# Patient Record
Sex: Female | Born: 1995 | Race: White | Hispanic: No | Marital: Single | Smoking: Former smoker
Health system: Southern US, Community
[De-identification: ages and names within clinical notes are randomized; demographics above are authoritative.]

## PROBLEM LIST (undated history)

## (undated) DIAGNOSIS — Z789 Other specified health status: Secondary | ICD-10-CM

## (undated) HISTORY — PX: OTHER SURGICAL HISTORY: SHX169

---

## 2019-04-13 ENCOUNTER — Other Ambulatory Visit: Payer: Self-pay

## 2019-04-13 ENCOUNTER — Encounter (HOSPITAL_COMMUNITY): Payer: Self-pay

## 2019-04-13 ENCOUNTER — Emergency Department (HOSPITAL_COMMUNITY)
Admission: EM | Admit: 2019-04-13 | Discharge: 2019-04-13 | Disposition: A | Discharge status: Home / Self Care | Payer: Medicaid Other | Attending: Emergency Medicine | Admitting: Emergency Medicine

## 2019-04-13 ENCOUNTER — Emergency Department (HOSPITAL_COMMUNITY): Payer: Medicaid Other

## 2019-04-13 DIAGNOSIS — R0602 Shortness of breath: Secondary | ICD-10-CM | POA: Diagnosis not present

## 2019-04-13 DIAGNOSIS — F17210 Nicotine dependence, cigarettes, uncomplicated: Secondary | ICD-10-CM | POA: Diagnosis not present

## 2019-04-13 DIAGNOSIS — R0789 Other chest pain: Secondary | ICD-10-CM | POA: Diagnosis not present

## 2019-04-13 HISTORY — DX: Other specified health status: Z78.9

## 2019-04-13 LAB — CBC WITH DIFFERENTIAL/PLATELET
Abs Immature Granulocytes: 0.05 10*3/uL (ref 0.00–0.07)
Basophils Absolute: 0.1 10*3/uL (ref 0.0–0.1)
Basophils Relative: 1 %
Eosinophils Absolute: 0.8 10*3/uL — ABNORMAL HIGH (ref 0.0–0.5)
Eosinophils Relative: 7 %
HCT: 46.2 % — ABNORMAL HIGH (ref 36.0–46.0)
Hemoglobin: 14.9 g/dL (ref 12.0–15.0)
Immature Granulocytes: 0 %
Lymphocytes Relative: 28 %
Lymphs Abs: 3.2 10*3/uL (ref 0.7–4.0)
MCH: 29 pg (ref 26.0–34.0)
MCHC: 32.3 g/dL (ref 30.0–36.0)
MCV: 90.1 fL (ref 80.0–100.0)
Monocytes Absolute: 0.5 10*3/uL (ref 0.1–1.0)
Monocytes Relative: 4 %
Neutro Abs: 7 10*3/uL (ref 1.7–7.7)
Neutrophils Relative %: 60 %
Platelets: 502 10*3/uL — ABNORMAL HIGH (ref 150–400)
RBC: 5.13 MIL/uL — ABNORMAL HIGH (ref 3.87–5.11)
RDW: 12.7 % (ref 11.5–15.5)
WBC: 11.7 10*3/uL — ABNORMAL HIGH (ref 4.0–10.5)
nRBC: 0 % (ref 0.0–0.2)

## 2019-04-13 LAB — URINALYSIS, ROUTINE W REFLEX MICROSCOPIC
Bacteria, UA: NONE SEEN
Bilirubin Urine: NEGATIVE
Glucose, UA: NEGATIVE mg/dL
Ketones, ur: NEGATIVE mg/dL
Leukocytes,Ua: NEGATIVE
Nitrite: NEGATIVE
Protein, ur: NEGATIVE mg/dL
Specific Gravity, Urine: 1.008 (ref 1.005–1.030)
pH: 6 (ref 5.0–8.0)

## 2019-04-13 LAB — PREGNANCY, URINE: Preg Test, Ur: NEGATIVE

## 2019-04-13 LAB — BASIC METABOLIC PANEL
Anion gap: 11 (ref 5–15)
BUN: 8 mg/dL (ref 6–20)
CO2: 22 mmol/L (ref 22–32)
Calcium: 9.9 mg/dL (ref 8.9–10.3)
Chloride: 107 mmol/L (ref 98–111)
Creatinine, Ser: 0.72 mg/dL (ref 0.44–1.00)
GFR calc Af Amer: >60 mL/min (ref >60)
GFR calc non Af Amer: >60 mL/min (ref >60)
Glucose, Bld: 93 mg/dL (ref 70–99)
Potassium: 3.5 mmol/L (ref 3.5–5.1)
Sodium: 140 mmol/L (ref 135–145)

## 2019-04-13 LAB — I-STAT BETA HCG BLOOD, ED (MC, WL, AP ONLY): I-stat hCG, quantitative: <5 m[IU]/mL (ref <5)

## 2019-04-13 LAB — D-DIMER, QUANTITATIVE: D-Dimer, Quant: 0.33 ug/mL-FEU (ref 0.00–0.50)

## 2019-04-13 LAB — TROPONIN I: Troponin I: <0.03 ng/mL (ref <0.03)

## 2019-04-13 MED ORDER — METHOCARBAMOL 500 MG PO TABS: 500.0000 mg | ORAL_TABLET | Freq: Two times a day (BID) | ORAL | 0 refills | Status: AC

## 2019-04-13 MED ORDER — KETOROLAC TROMETHAMINE 30 MG/ML IJ SOLN
30.0000 mg | Freq: Once | INTRAMUSCULAR | Status: AC
  Administered 2019-04-13: 22:00:00 30 mg via INTRAVENOUS
  Filled 2019-04-13: qty 1

## 2019-04-13 NOTE — ED Triage Notes (Signed)
Patient c/o CP for the last 6-1/2 hours and thinks it's acid reflux. Tender with palpation. Given 324 asa and 1 ntiro. Took tablet for heartburn with no relief. NAD noted at this time.

## 2019-04-13 NOTE — ED Provider Notes (Signed)
[facility] EMERGENCY DEPARTMENT Provider Note   CSN: [id number] Arrival date & time: 04/13/19  2032    History   Chief Complaint Chief Complaint  Patient presents with  . Chest Pain    HPI Bailey Mullins is a 23 y.o. female with no significant past female history who presents for evaluation of midsternal chest pain that began at about 12 PM this afternoon.  She states that since onset of symptoms, the chest pain has been constant.  Patient reports that she was sitting down when she started having midsternal chest pain.  She reports initially, the pain was sharp only in the midsternal region.  She states that then it started becoming a pressure.  She states that the pain was worse with deep inspiration, palpation and exertion.  She did not have any associated diaphoresis or nausea.  Patient states that she took some acid reflux medication but did not help.  She also reports she had some associated shortness of breath and felt like "she could not catch her breath."  Patient reports she tried to sit at home and states that it would get better but when it was not, her brother called EMS.  On EMS arrival, they gave 324 aspirin and 1 times nitro.  Patient states that after the nitro, her pain somewhat improved.  She states that it is currently 5/10.  Patient states she has no previous issues with her heart.  She denies any family history of MIs before the age of 42.  She reports she smokes but denies any cocaine, heroin, marijuana use. She denies any OCP use, recent immobilization, prior history of DVT/PE, recent surgery, leg swelling, or long travel.  Patient denies any fevers, cough, abdominal pain, nausea/vomiting.     The history is provided by the patient.    Past Medical History:  Diagnosis Date  . Known health problems: none     There are no active problems to display for this patient.   Past Surgical History:  Procedure Laterality Date  . No surgical hx       OB  History   No obstetric history on file.      Home Medications    Prior to Admission medications   Medication Sig Start Date End Date Taking? Authorizing Provider  OVER THE COUNTER MEDICATION Take 250 mg by mouth every 6 (six) hours as needed (pain/headache).   Yes [provider]  methocarbamol (ROBAXIN) 500 MG tablet Take 1 tablet (500 mg total) by mouth 2 (two) times daily. 04/13/19   Maxwell Caul, PA-C    Family History History reviewed. No pertinent family history.  Social History Social History   Tobacco Use  . Smoking status: Current Every Day Smoker  . Smokeless tobacco: Never Used  Substance Use Topics  . Alcohol use: Never    Frequency: Never  . Drug use: Never     Allergies   Patient has no known allergies.   Review of Systems Review of Systems  Constitutional: Negative for fever.  Respiratory: Positive for shortness of breath. Negative for cough.   Cardiovascular: Positive for chest pain. Negative for leg swelling.  Gastrointestinal: Negative for abdominal pain, nausea and vomiting.  Genitourinary: Negative for dysuria and hematuria.  Neurological: Negative for headaches.  All other systems reviewed and are negative.    Physical Exam Updated Vital Signs BP (!) 113/51   Pulse 92   Temp 99.5 F (37.5 C) (Oral)   Resp (!) 24  Ht  (1.753 m)   Wt 113.9 kg   LMP 04/13/2019 (Exact Date)   SpO2 98%   BMI 37.07 kg/m   Physical Exam Vitals signs and nursing note reviewed.  Constitutional:      Appearance: Normal appearance. She is well-developed.  HENT:     Head: Normocephalic and atraumatic.  Eyes:     General: Lids are normal.     Conjunctiva/sclera: Conjunctivae normal.     Pupils: Pupils are equal, round, and reactive to light.  Neck:     Musculoskeletal: Full passive range of motion without pain.  Cardiovascular:     Rate and Rhythm: Normal rate and regular rhythm.     Pulses: Normal pulses.          Radial pulses are  2+ on the right side and 2+ on the left side.       Dorsalis pedis pulses are 2+ on the right side and 2+ on the left side.     Heart sounds: Normal heart sounds. No murmur. No friction rub. No gallop.   Pulmonary:     Effort: Pulmonary effort is normal.     Breath sounds: Normal breath sounds.     Comments: Lungs clear to auscultation bilaterally.  Symmetric chest rise.  No wheezing, rales, rhonchi.  Speaking in full sentences without any difficulty.  No evidence of respiratory distress. Chest:     Comments: Pain reproduced with palpation of the midsternal region.  No deformity or crepitus noted. Abdominal:     Palpations: Abdomen is soft. Abdomen is not rigid.     Tenderness: There is no abdominal tenderness. There is no guarding.  Musculoskeletal: Normal range of motion.     Comments: Bilateral lower extremities are symmetric in appearance without any overlying warmth, erythema, edema.  Skin:    General: Skin is warm and dry.     Capillary Refill: Capillary refill takes less than 2 seconds.  Neurological:     Mental Status: She is alert and oriented to person, place, and time.  Psychiatric:        Speech: Speech normal.      ED Treatments / Results  Labs (all labs ordered are listed, but only abnormal results are displayed) Labs Reviewed  CBC WITH DIFFERENTIAL/PLATELET - Abnormal; Notable for the following components:      Result Value   WBC 11.7 (*)    RBC 5.13 (*)    HCT 46.2 (*)    Platelets 502 (*)    Eosinophils Absolute 0.8 (*)    All other components within normal limits  URINALYSIS, ROUTINE W REFLEX MICROSCOPIC - Abnormal; Notable for the following components:   Color, Urine STRAW (*)    Hgb urine dipstick SMALL (*)    All other components within normal limits  BASIC METABOLIC PANEL  D-DIMER, QUANTITATIVE (NOT AT [facility])  TROPONIN I  PREGNANCY, URINE  I-STAT BETA HCG BLOOD, ED ([facility], [facility], [facility] ONLY)    EKG EKG Interpretation  Date/Time:  Friday Apr 13 2019  20:37:26 EDT Ventricular Rate:  103 PR Interval:    QRS Duration: 92 QT Interval:  341 QTC Calculation: 447 R Axis:   65 Text Interpretation:  Sinus tachycardia No old tracing to compare Confirmed by Melene Plan [phone]) on 04/13/2019 8:49:47 PM   Radiology Dg Chest 2 View  Result Date: 04/13/2019 CLINICAL DATA:  Chest pain EXAM: CHEST - 2 VIEW COMPARISON:  None. FINDINGS: Heart and mediastinal contours are within normal limits. No focal  opacities or effusions. No acute bony abnormality. IMPRESSION: No active cardiopulmonary disease. Electronically Signed   By: Charlett NoseKevin  Dover M.D.   On: 04/13/2019 21:55    Procedures Procedures (including critical care time)  Medications Ordered in ED Medications  ketorolac (TORADOL) 30 MG/ML injection 30 mg (30 mg Intravenous Given 04/13/19 2151)     Initial Impression / Assessment and Plan / ED Course  I have reviewed the triage vital signs and the nursing notes.  Pertinent labs & imaging results that were available during my care of the patient were reviewed by me and considered in my medical decision making (see chart for details).        23 year old female who presents for evaluation of chest pain that began today at 7512 PM.  She reports some associated shortness of breath.  Chest pain worse with exertion, deep inspiration and palpation.  No associated diaphoresis, nausea/vomiting. Patient is afebrile, non-toxic appearing, sitting comfortably on examination table. Vital signs reviewed and stable.  On exam, pain reproduced with palpation.  Consider musculoskeletal pain versus anxiety.  Also consider ACS etiology but this sounds very atypical.  Low suspicion for PE but patient does report some pleuritic chest pain.  Plan to check labs, chest x-ray, EKG.  Troponin is negative.  BMP is unremarkable.  I-STAT beta is normal.  D-dimer is normal.  CBC shows slight leukocytosis of 11.7.  Otherwise unremarkable.  UA negative for any infectious etiology.  Chest  x-ray negative for any infectious etiology.  At this time, patient's pain has been constant for greater than 6 hours.  Given constant nature of pain and atypical nature, 1 troponin is sufficient for ACS rule out.  Additionally, low risk for ACS etiology.  She does have risk factors in terms of smoking but has no history of hypertension or family history.  This time, given the pleuritic chest pain as well as pain worsened with palpation, do not suspect ACS etiology as a source of her symptoms.  Discussed results with patient.  She reports that pain has resolved after Toradol here in the department.  Vital signs are stable.  Patient states she is ready to go home.  I discussed her work-up here in the ED.  Encourage patient to follow-up with primary care doctor. At this time, patient exhibits no emergent life-threatening condition that require further evaluation in ED or admission. Patient had ample opportunity for questions and discussion. All patient's questions were answered with full understanding. Strict return precautions discussed. Patient expresses understanding and agreement to plan.   Portions of this note were generated with Scientist, clinical (histocompatibility and immunogenetics)Dragon dictation software. Dictation errors may occur despite best attempts at proofreading.  Final Clinical Impressions(s) / ED Diagnoses   Final diagnoses:  Atypical chest pain    ED Discharge Orders         Ordered    methocarbamol (ROBAXIN) 500 MG tablet  2 times daily     04/13/19 2319           Maxwell CaulLayden, Quianna Avery A, PA-C 04/14/19 2308    Melene PlanFloyd, Dan, DO 04/16/19 [id number]

## 2019-04-13 NOTE — Discharge Instructions (Signed)
Your work-up today was reassuring.  Please follow-up with your primary care doctor or the [facility] for further evaluation.  You can take Tylenol or Ibuprofen as directed for pain. You can alternate Tylenol and Ibuprofen every 4 hours. If you take Tylenol at 1pm, then you can take Ibuprofen at 5pm. Then you can take Tylenol again at 9pm.   Take Robaxin as prescribed. This medication will make you drowsy so do not drive or drink alcohol when taking it.  Return to the Emergency Department immediately if you experiencing worsening chest pain, difficulty breathing, nausea/vomiting, get very sweaty, headache or any other worsening or concerning symptoms.

## 2019-04-17 ENCOUNTER — Emergency Department (HOSPITAL_COMMUNITY): Payer: Medicaid Other

## 2019-04-17 ENCOUNTER — Other Ambulatory Visit: Payer: Self-pay

## 2019-04-17 ENCOUNTER — Emergency Department (HOSPITAL_COMMUNITY)
Admission: EM | Admit: 2019-04-17 | Discharge: 2019-04-17 | Disposition: A | Discharge status: Home / Self Care | Payer: Medicaid Other | Attending: Emergency Medicine | Admitting: Emergency Medicine

## 2019-04-17 ENCOUNTER — Encounter (HOSPITAL_COMMUNITY): Payer: Self-pay | Admitting: Emergency Medicine

## 2019-04-17 DIAGNOSIS — R079 Chest pain, unspecified: Secondary | ICD-10-CM | POA: Diagnosis present

## 2019-04-17 DIAGNOSIS — R072 Precordial pain: Secondary | ICD-10-CM | POA: Insufficient documentation

## 2019-04-17 DIAGNOSIS — Z79899 Other long term (current) drug therapy: Secondary | ICD-10-CM | POA: Insufficient documentation

## 2019-04-17 DIAGNOSIS — Z87891 Personal history of nicotine dependence: Secondary | ICD-10-CM | POA: Insufficient documentation

## 2019-04-17 LAB — TROPONIN I: Troponin I: <0.03 ng/mL (ref <0.03)

## 2019-04-17 LAB — CBC
HCT: 45.2 % (ref 36.0–46.0)
Hemoglobin: 15 g/dL (ref 12.0–15.0)
MCH: 29.2 pg (ref 26.0–34.0)
MCHC: 33.2 g/dL (ref 30.0–36.0)
MCV: 87.9 fL (ref 80.0–100.0)
Platelets: 476 10*3/uL — ABNORMAL HIGH (ref 150–400)
RBC: 5.14 MIL/uL — ABNORMAL HIGH (ref 3.87–5.11)
RDW: 12.8 % (ref 11.5–15.5)
WBC: 10.7 10*3/uL — ABNORMAL HIGH (ref 4.0–10.5)
nRBC: 0 % (ref 0.0–0.2)

## 2019-04-17 LAB — I-STAT BETA HCG BLOOD, ED (MC, WL, AP ONLY): I-stat hCG, quantitative: <5 m[IU]/mL (ref <5)

## 2019-04-17 LAB — BASIC METABOLIC PANEL
Anion gap: 8 (ref 5–15)
BUN: 10 mg/dL (ref 6–20)
CO2: 21 mmol/L — ABNORMAL LOW (ref 22–32)
Calcium: 9.5 mg/dL (ref 8.9–10.3)
Chloride: 109 mmol/L (ref 98–111)
Creatinine, Ser: 0.74 mg/dL (ref 0.44–1.00)
GFR calc Af Amer: >60 mL/min (ref >60)
GFR calc non Af Amer: >60 mL/min (ref >60)
Glucose, Bld: 113 mg/dL — ABNORMAL HIGH (ref 70–99)
Potassium: 3.9 mmol/L (ref 3.5–5.1)
Sodium: 138 mmol/L (ref 135–145)

## 2019-04-17 MED ORDER — SODIUM CHLORIDE 0.9% FLUSH: 3.0000 mL | Freq: Once | INTRAVENOUS | Status: DC

## 2019-04-17 MED ORDER — IBUPROFEN 400 MG PO TABS
600.0000 mg | ORAL_TABLET | Freq: Once | ORAL | Status: AC
  Administered 2019-04-17: 600 mg via ORAL
  Filled 2019-04-17: qty 1

## 2019-04-17 MED ORDER — IBUPROFEN 600 MG PO TABS: 600.0000 mg | ORAL_TABLET | Freq: Three times a day (TID) | ORAL | 0 refills | Status: AC | PRN

## 2019-04-17 NOTE — ED Provider Notes (Signed)
[facility] EMERGENCY DEPARTMENT Provider Note   CSN: [id number] Arrival date & time: 04/17/19  2041    History   Chief Complaint Chief Complaint  Patient presents with  . Chest Pain    HPI Jamaiya Tuomi is a 23 y.o. female.     Patient c/o midline/mid chest pain for the past 5 days. Symptoms gradual onset, constant, persistent, non radiating. No specific exacerbating or alleviating factors. No change w activity or exertion. No worse whether upright or supine. Denies preceding or current uri symptoms. No fever or cough. No pleuritic pain. No leg pain or swelling. No recent surgery, trauma, travel or immobility. No hx dvt or pe. Denies associated nv, diaphoresis or sob. No heartburn. No chest wall trauma or strain.   The history is provided by the patient.  Chest Pain  Associated symptoms: no abdominal pain, no back pain, no cough, no fever, no headache, no nausea, no palpitations, no shortness of breath and no vomiting     Past Medical History:  Diagnosis Date  . Known health problems: none     There are no active problems to display for this patient.   Past Surgical History:  Procedure Laterality Date  . No surgical hx       OB History   No obstetric history on file.      Home Medications    Prior to Admission medications   Medication Sig Start Date End Date Taking? Authorizing Provider  methocarbamol (ROBAXIN) 500 MG tablet Take 1 tablet (500 mg total) by mouth 2 (two) times daily. 04/13/19   Maxwell Caul, PA-C  OVER THE COUNTER MEDICATION Take 250 mg by mouth every 6 (six) hours as needed (pain/headache).    [provider]    Family History History reviewed. No pertinent family history.  Social History Social History   Tobacco Use  . Smoking status: Former Smoker    Last attempt to quit: 04/13/2019    Years since quitting: 0.0  . Smokeless tobacco: Never Used  Substance Use Topics  . Alcohol use: Never    Frequency:  Never  . Drug use: Never     Allergies   Patient has no known allergies.   Review of Systems Review of Systems  Constitutional: Negative for fever.  HENT: Negative for sore throat.   Eyes: Negative for redness.  Respiratory: Negative for cough and shortness of breath.   Cardiovascular: Positive for chest pain. Negative for palpitations and leg swelling.  Gastrointestinal: Negative for abdominal pain, nausea and vomiting.  Genitourinary: Negative for flank pain.  Musculoskeletal: Negative for back pain and neck pain.  Skin: Negative for rash.  Neurological: Negative for headaches.  Hematological: Does not bruise/bleed easily.  Psychiatric/Behavioral: Negative for confusion.     Physical Exam Updated Vital Signs BP 115/86 (BP Location: Right Arm)   Pulse (!) 103   Temp 98.7 F (37.1 C) (Oral)   Resp 20   LMP 04/13/2019 (Exact Date)   SpO2 98%   Physical Exam Vitals signs and nursing note reviewed.  Constitutional:      Appearance: Normal appearance. She is well-developed.  HENT:     Head: Atraumatic.     Nose: Nose normal.     Mouth/Throat:     Mouth: Mucous membranes are moist.  Eyes:     General: No scleral icterus.    Conjunctiva/sclera: Conjunctivae normal.  Neck:     Musculoskeletal: Normal range of motion and neck supple. No neck rigidity or  muscular tenderness.     Trachea: No tracheal deviation.  Cardiovascular:     Rate and Rhythm: Normal rate and regular rhythm.     Pulses: Normal pulses.     Heart sounds: Normal heart sounds. No murmur. No friction rub. No gallop.   Pulmonary:     Effort: Pulmonary effort is normal. No respiratory distress.     Breath sounds: Normal breath sounds.     Comments: +mild chest wall tenderness.  Abdominal:     General: Bowel sounds are normal. There is no distension.     Palpations: Abdomen is soft.     Tenderness: There is no abdominal tenderness. There is no guarding.  Genitourinary:    Comments: No cva  tenderness.  Musculoskeletal:        General: No swelling.  Skin:    General: Skin is warm and dry.     Findings: No rash.  Neurological:     Mental Status: She is alert.     Comments: Alert, speech normal.   Psychiatric:        Mood and Affect: Mood normal.      ED Treatments / Results  Labs (all labs ordered are listed, but only abnormal results are displayed) Results for orders placed or performed during the hospital encounter of 04/17/19  Basic metabolic panel  Result Value Ref Range   Sodium 138 135 - 145 mmol/L   Potassium 3.9 3.5 - 5.1 mmol/L   Chloride 109 98 - 111 mmol/L   CO2 21 (L) 22 - 32 mmol/L   Glucose, Bld 113 (H) 70 - 99 mg/dL   BUN 10 6 - 20 mg/dL   Creatinine, Ser 0.980.74 0.44 - 1.00 mg/dL   Calcium 9.5 8.9 - 11.910.3 mg/dL   GFR calc non Af Amer >60 >60 mL/min   GFR calc Af Amer >60 >60 mL/min   Anion gap 8 5 - 15  CBC  Result Value Ref Range   WBC 10.7 (H) 4.0 - 10.5 K/uL   RBC 5.14 (H) 3.87 - 5.11 MIL/uL   Hemoglobin 15.0 12.0 - 15.0 g/dL   HCT 14.745.2 82.936.0 - 56.246.0 %   MCV 87.9 80.0 - 100.0 fL   MCH 29.2 26.0 - 34.0 pg   MCHC 33.2 30.0 - 36.0 g/dL   RDW 13.012.8 86.511.5 - 78.415.5 %   Platelets 476 (H) 150 - 400 K/uL   nRBC 0.0 0.0 - 0.2 %  Troponin I - ONCE - STAT  Result Value Ref Range   Troponin I <0.03 <0.03 ng/mL  I-Stat beta hCG blood, ED  Result Value Ref Range   I-stat hCG, quantitative <5.0 <5 mIU/mL   Comment 3           Dg Chest 2 View  Result Date: 04/17/2019 CLINICAL DATA:  Initial evaluation for acute central chest pain after eating. EXAM: CHEST - 2 VIEW COMPARISON:  Prior radiograph from 04/13/2019. FINDINGS: The cardiac and mediastinal silhouettes are stable in size and contour, and remain within normal limits. The lungs are normally inflated. No airspace consolidation, pleural effusion, or pulmonary edema is identified. There is no pneumothorax. No acute osseous abnormality identified. IMPRESSION: No active cardiopulmonary disease. Electronically  Signed   By: Rise MuBenjamin  McClintock M.D.   On: 04/17/2019 21:16   Dg Chest 2 View  Result Date: 04/13/2019 CLINICAL DATA:  Chest pain EXAM: CHEST - 2 VIEW COMPARISON:  None. FINDINGS: Heart and mediastinal contours are within normal limits. No focal opacities or effusions.  No acute bony abnormality. IMPRESSION: No active cardiopulmonary disease. Electronically Signed   By: Charlett Nose M.D.   On: 04/13/2019 21:55    EKG EKG Interpretation  Date/Time:  Tuesday Apr 17 2019 20:45:13 EDT Ventricular Rate:  108 PR Interval:  156 QRS Duration: 86 QT Interval:  336 QTC Calculation: 450 R Axis:   67 Text Interpretation:  Sinus tachycardia Nonspecific ST abnormality `?pr depression, ?pericarditis Confirmed by Cathren Laine ([id number]) on 04/17/2019 10:12:37 PM   Radiology Dg Chest 2 View  Result Date: 04/17/2019 CLINICAL DATA:  Initial evaluation for acute central chest pain after eating. EXAM: CHEST - 2 VIEW COMPARISON:  Prior radiograph from 04/13/2019. FINDINGS: The cardiac and mediastinal silhouettes are stable in size and contour, and remain within normal limits. The lungs are normally inflated. No airspace consolidation, pleural effusion, or pulmonary edema is identified. There is no pneumothorax. No acute osseous abnormality identified. IMPRESSION: No active cardiopulmonary disease. Electronically Signed   By: Rise Mu M.D.   On: 04/17/2019 21:16    Procedures Procedures (including critical care time)  Medications Ordered in ED Medications  sodium chloride flush (NS) 0.9 % injection 3 mL (has no administration in time range)  ibuprofen (ADVIL) tablet 600 mg (has no administration in time range)     Initial Impression / Assessment and Plan / ED Course  I have reviewed the triage vital signs and the nursing notes.  Pertinent labs & imaging results that were available during my care of the patient were reviewed by me and considered in my medical decision making (see chart for  details).  Labs. Ecg. Cxr.   Reviewed nursing notes and prior charts for additional history. Recent prior ED visit for same symptoms - labs, trop, ddimer then normal.   Today's labs reviewed by me - trop normal.   CXR reviewed by me - no pna, heart size normal.   On exam, no rub. No lateralizing or pleuritic chest pain. ecg raises possibility of pericarditis.  Motrin po.   Discussed diff dx w pt.   Patient currently appears comfortable whether upright or reclined. No increased wob.   Patient currently appears stable for d/c.   rec outpt cardiology f/u.   Return precautions provided.       Final Clinical Impressions(s) / ED Diagnoses   Final diagnoses:  None    ED Discharge Orders    None       Cathren Laine, MD 04/17/19 2226

## 2019-04-17 NOTE — ED Notes (Signed)
RN removed 22 G IV placed to R hand by EMS, site clean, dry, and intact.

## 2019-04-17 NOTE — ED Triage Notes (Signed)
Pt here via [facility], c/o CP intermittently since 5/1 (was seen here then), sharp central CP after eating dinner with the pain radiating to right side of back. Reports mild SOB. Pt given 1 nitro and 324 ASA.

## 2019-04-17 NOTE — Discharge Instructions (Signed)
It was our pleasure to provide your ER care today - we hope that you feel better.  Take motrin as need for pain.  Follow up with cardiologist in the coming week - call office tomorrow AM to arrange appointment.   Return to ER if worse, new symptoms, increased trouble breathing, weak/fainting, other concern.

## 2019-04-26 ENCOUNTER — Inpatient Hospital Stay: Payer: Medicaid Other | Admitting: Family Medicine

## 2019-04-30 ENCOUNTER — Inpatient Hospital Stay: Payer: Medicaid Other | Admitting: Family Medicine

## 2019-05-09 NOTE — Progress Notes (Signed)
Patient ID: Bailey Mullins, female   DOB: Apr 28, 1996, 23 y.o.   MRN: [id number]  Virtual Visit via Telephone Note  I connected with Jilda Panda on 05/10/19 at 10:30 AM EDT by telephone and verified that I am speaking with the correct person using two identifiers.   I discussed the limitations, risks, security and privacy concerns of performing an evaluation and management service by telephone and the availability of in person appointments. I also discussed with the patient that there may be a patient responsible charge related to this service. The patient expressed understanding and agreed to proceed.  Patient location:  home My Location:  [facility] office Persons on the call: myself and the patient.     History of Present Illness: After being seen in the ED 5/1 and 04/17/2019 for CP.  Work-up unremarkable.  It was recommended that she see a cardiologist.  Denies any further episodes of CP.  No SOB.  No palpitations.     Has 2 young children.  Took a pregnancy test last week and it was positive.  She does not have an OB/Gyn here.  Other children born in [place].  Denies any bleeding/spotting/abdominal pain. LMP February 2020   Observations/Objective:  A&Ox3.  TP linear.  Patient talking loudly to others in the background.  Seems distracted at times.   Assessment and Plan: 1. Chest pain, unspecified type resolved - Ambulatory referral to Cardiology  2. Less than [redacted] weeks gestation of pregnancy LMP February but neg pregnancy test May 5 at ED.  Positive home test last week - Ambulatory referral to Obstetrics / Gynecology  3. Hospital discharge follow-up No further symptoms    Follow Up Instructions: 2-3 months to establish care   I discussed the assessment and treatment plan with the patient. The patient was provided an opportunity to ask questions and all were answered. The patient agreed with the plan and demonstrated an understanding of the instructions.   The patient  was advised to call back or seek an in-person evaluation if the symptoms worsen or if the condition fails to improve as anticipated.  I provided 12 minutes of non-face-to-face time during this encounter.   Georgian Co, PA-C

## 2019-05-10 ENCOUNTER — Ambulatory Visit: Payer: Medicaid Other | Attending: Family Medicine | Admitting: Physician Assistant

## 2019-05-10 ENCOUNTER — Other Ambulatory Visit: Payer: Self-pay

## 2019-05-10 DIAGNOSIS — R079 Chest pain, unspecified: Secondary | ICD-10-CM

## 2019-05-10 DIAGNOSIS — Z09 Encounter for follow-up examination after completed treatment for conditions other than malignant neoplasm: Secondary | ICD-10-CM

## 2019-05-10 DIAGNOSIS — Z3A01 Less than 8 weeks gestation of pregnancy: Secondary | ICD-10-CM

## 2019-12-07 IMAGING — CR CHEST - 2 VIEW
2 series · 2 of 2 positions shown · non-contrast
Comparison: None.

CLINICAL DATA: Chest pain

EXAM:
CHEST - 2 VIEW

[chest pa]
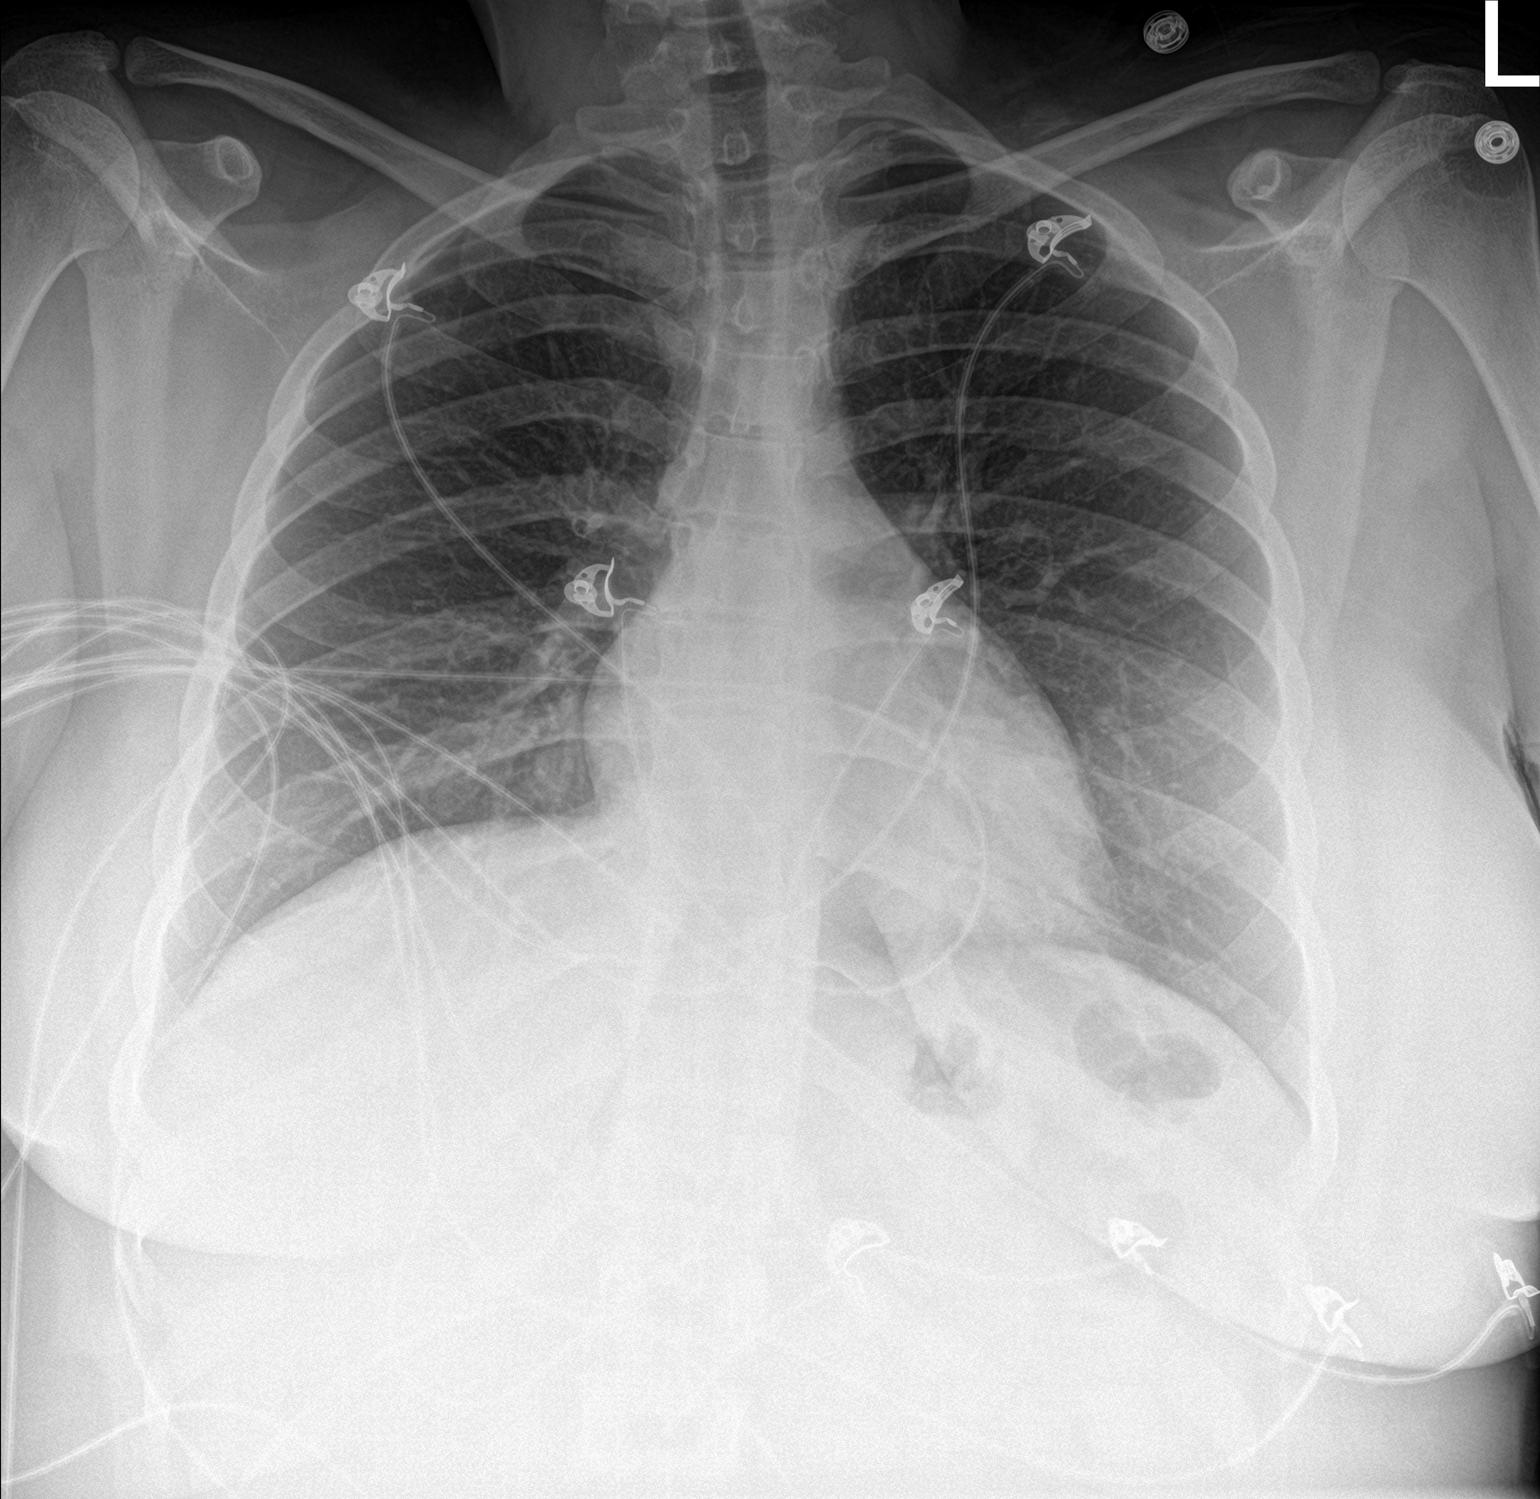

[chest lat]
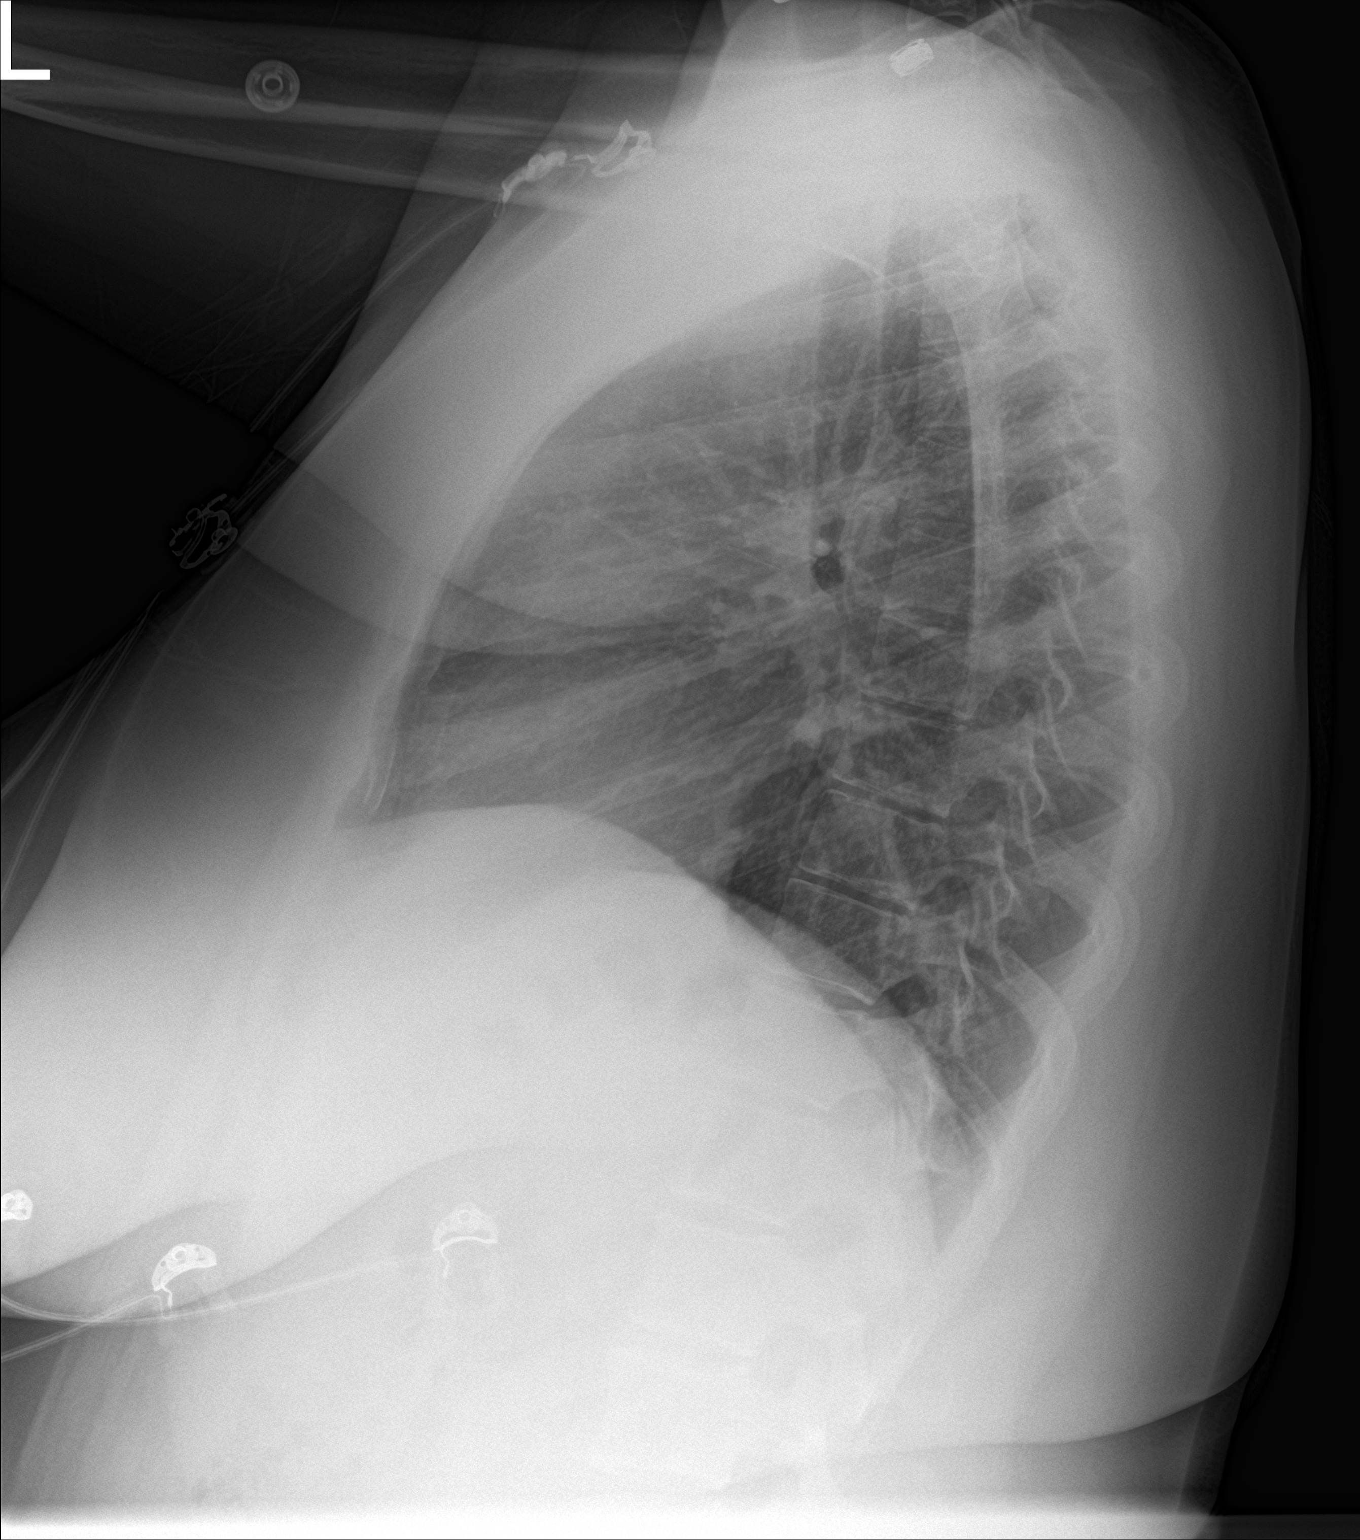

[2 of 2 positions shown; findings below may reference images not displayed]

FINDINGS: Heart and mediastinal contours are within normal limits. No focal
opacities or effusions. No acute bony abnormality.
IMPRESSION: No active cardiopulmonary disease.
